# Patient Record
Sex: Female | Born: 1995 | Race: White | Hispanic: No | Marital: Single | Smoking: Never smoker
Health system: Southern US, Community
[De-identification: ages and names within clinical notes are randomized; demographics above are authoritative.]

## PROBLEM LIST (undated history)

## (undated) DIAGNOSIS — M419 Scoliosis, unspecified: Secondary | ICD-10-CM

## (undated) DIAGNOSIS — A6920 Lyme disease, unspecified: Secondary | ICD-10-CM

## (undated) HISTORY — PX: WISDOM TOOTH EXTRACTION: SHX21

---

## 2014-03-29 ENCOUNTER — Emergency Department (HOSPITAL_COMMUNITY): Payer: 59

## 2014-03-29 ENCOUNTER — Emergency Department (HOSPITAL_COMMUNITY)
Admission: EM | Admit: 2014-03-29 | Discharge: 2014-03-29 | Disposition: A | Discharge status: Home / Self Care | Payer: 59 | Attending: Emergency Medicine | Admitting: Emergency Medicine

## 2014-03-29 ENCOUNTER — Encounter (HOSPITAL_COMMUNITY): Payer: Self-pay | Admitting: Emergency Medicine

## 2014-03-29 DIAGNOSIS — R52 Pain, unspecified: Secondary | ICD-10-CM

## 2014-03-29 DIAGNOSIS — Y998 Other external cause status: Secondary | ICD-10-CM | POA: Diagnosis not present

## 2014-03-29 DIAGNOSIS — Y9389 Activity, other specified: Secondary | ICD-10-CM | POA: Diagnosis not present

## 2014-03-29 DIAGNOSIS — W109XXA Fall (on) (from) unspecified stairs and steps, initial encounter: Secondary | ICD-10-CM | POA: Diagnosis not present

## 2014-03-29 DIAGNOSIS — Z8619 Personal history of other infectious and parasitic diseases: Secondary | ICD-10-CM | POA: Diagnosis not present

## 2014-03-29 DIAGNOSIS — Z8739 Personal history of other diseases of the musculoskeletal system and connective tissue: Secondary | ICD-10-CM | POA: Diagnosis not present

## 2014-03-29 DIAGNOSIS — S93402A Sprain of unspecified ligament of left ankle, initial encounter: Secondary | ICD-10-CM | POA: Insufficient documentation

## 2014-03-29 DIAGNOSIS — Y9289 Other specified places as the place of occurrence of the external cause: Secondary | ICD-10-CM | POA: Insufficient documentation

## 2014-03-29 DIAGNOSIS — S99912A Unspecified injury of left ankle, initial encounter: Secondary | ICD-10-CM | POA: Diagnosis present

## 2014-03-29 HISTORY — DX: Scoliosis, unspecified: M41.9

## 2014-03-29 HISTORY — DX: Lyme disease, unspecified: A69.20

## 2014-03-29 MED ORDER — IBUPROFEN 600 MG PO TABS: 600.0000 mg | ORAL_TABLET | Freq: Four times a day (QID) | ORAL | Status: AC | PRN

## 2014-03-29 MED ORDER — IBUPROFEN 800 MG PO TABS
800.0000 mg | ORAL_TABLET | Freq: Once | ORAL | Status: AC
  Administered 2014-03-29: 800 mg via ORAL
  Filled 2014-03-29: qty 1

## 2014-03-29 NOTE — ED Notes (Signed)
Pt presents with L ankle pain after slipping on steps yesterday with wet shoes in her apt building at [employer]. Ace wrap in place.

## 2014-03-29 NOTE — Discharge Instructions (Signed)
Ankle Sprain °An ankle sprain is an injury to the strong, fibrous tissues (ligaments) that hold the bones of your ankle joint together.  °CAUSES °An ankle sprain is usually caused by a fall or by twisting your ankle. Ankle sprains most commonly occur when you step on the outer edge of your foot, and your ankle turns inward. People who participate in sports are more prone to these types of injuries.  °SYMPTOMS  °· Pain in your ankle. The pain may be present at rest or only when you are trying to stand or walk. °· Swelling. °· Bruising. Bruising may develop immediately or within 1 to 2 days after your injury. °· Difficulty standing or walking, particularly when turning corners or changing directions. °DIAGNOSIS  °Your caregiver will ask you details about your injury and perform a physical exam of your ankle to determine if you have an ankle sprain. During the physical exam, your caregiver will press on and apply pressure to specific areas of your foot and ankle. Your caregiver will try to move your ankle in certain ways. An X-ray exam may be done to be sure a bone was not broken or a ligament did not separate from one of the bones in your ankle (avulsion fracture).  °TREATMENT  °Certain types of braces can help stabilize your ankle. Your caregiver can make a recommendation for this. Your caregiver may recommend the use of medicine for pain. If your sprain is severe, your caregiver may refer you to a surgeon who helps to restore function to parts of your skeletal system (orthopedist) or a physical therapist. °HOME CARE INSTRUCTIONS  °· Apply ice to your injury for 1-2 days or as directed by your caregiver. Applying ice helps to reduce inflammation and pain. °¨ Put ice in a plastic bag. °¨ Place a towel between your skin and the bag. °¨ Leave the ice on for 15-20 minutes at a time, every 2 hours while you are awake. °· Only take over-the-counter or prescription medicines for pain, discomfort, or fever as directed by  your caregiver. °· Elevate your injured ankle above the level of your heart as much as possible for 2-3 days. °· If your caregiver recommends crutches, use them as instructed. Gradually put weight on the affected ankle. Continue to use crutches or a cane until you can walk without feeling pain in your ankle. °· If you have a plaster splint, wear the splint as directed by your caregiver. Do not rest it on anything harder than a pillow for the first 24 hours. Do not put weight on it. Do not get it wet. You may take it off to take a shower or bath. °· You may have been given an elastic bandage to wear around your ankle to provide support. If the elastic bandage is too tight (you have numbness or tingling in your foot or your foot becomes cold and blue), adjust the bandage to make it comfortable. °· If you have an air splint, you may blow more air into it or let air out to make it more comfortable. You may take your splint off at night and before taking a shower or bath. Wiggle your toes in the splint several times per day to decrease swelling. °SEEK MEDICAL CARE IF:  °· You have rapidly increasing bruising or swelling. °· Your toes feel extremely cold or you lose feeling in your foot. °· Your pain is not relieved with medicine. °SEEK IMMEDIATE MEDICAL CARE IF: °· Your toes are numb or blue. °·   You have severe pain that is increasing. °MAKE SURE YOU:  °· Understand these instructions. °· Will watch your condition. °· Will get help right away if you are not doing well or get worse. °Document Released: 04/14/2005 Document Revised: 01/07/2012 Document Reviewed: 04/26/2011 °ExitCare® Patient Information ©2015 [employer]. This information is not intended to replace advice given to you by your health care provider. Make sure you discuss any questions you have with your health care provider. ° ° °Emergency Department Resource Guide °1) Find a Doctor and Pay Out of Pocket °Although you won't have to find out who is covered  by your insurance plan, it is a good idea to ask around and get recommendations. You will then need to call the office and see if the doctor you have chosen will accept you as a new patient and what types of options they offer for patients who are self-pay. Some doctors offer discounts or will set up payment plans for their patients who do not have insurance, but you will need to ask so you aren't surprised when you get to your appointment. ° °2) Contact Your Local Health Department °Not all health departments have doctors that can see patients for sick visits, but many do, so it is worth a call to see if yours does. If you don't know where your local health department is, you can check in your phone book. The CDC also has a tool to help you locate your state's health department, and many state websites also have listings of all of their local health departments. ° °3) Find a Walk-in Clinic °If your illness is not likely to be very severe or complicated, you may want to try a walk in clinic. These are popping up all over the country in pharmacies, drugstores, and shopping centers. They're usually staffed by nurse practitioners or physician assistants that have been trained to treat common illnesses and complaints. They're usually fairly quick and inexpensive. However, if you have serious medical issues or chronic medical problems, these are probably not your best option. ° °No Primary Care Doctor: °- Call [facility] at  832-8000 - they can help you locate a primary care doctor that  accepts your insurance, provides certain services, etc. °- Physician Referral Service- 1-800-533-3463 ° °Chronic Pain Problems: °Organization         Address  Phone   Notes  °[facility]  (336) 297-2271 Patients need to be referred by their primary care doctor.  ° °Medication Assistance: °Organization         Address  Phone   Notes  °[place] [facility] [address] °[place], [place] [address] (336) 641-8030 --Must be a resident of [place] °-- Must have NO insurance coverage whatsoever (no Medicaid/ Medicare, etc.) °-- The pt. MUST have a primary care doctor that directs their care regularly and follows them in the community °  °[facility]  (866) 331-1348   °[employer]  (888) 892-1162   ° °Agencies that provide inexpensive medical care: °Organization         Address  Phone   Notes  °[facility]  (336) 832-8035   °[facility]    (336) 832-7272   °[facility] [address] °[place], [place] [address] (336) 832-4777   °[facility] [address], °[place] (336) 271-4999   °[facility]    (336) 373-0678   °[facility]    (  336) 272-1050   °[facility] ° [address], [place] Phone:  (336) 832-4444, Fax:  (336) 832-4440 Hours of Operation:  9 am - 6 pm, M-F.  Also accepts Medicaid/Medicare and self-pay.  °[facility] ° [address], [place] Phone: (336) 832-3150, Fax: (336) 832-3151. Hours of Operation:  8:30 am - 5:30 pm, M-F.  Also accepts Medicaid and self-pay.  °[facility] [place] [address], [place] Phone: (336) 878-6027   °[facility] [address], [place], [place] (336)723-1848, Ext. 123 Mondays & Thursdays: 7-9 AM.  First 15 patients are seen on a first come, first serve basis. °  ° °Medicaid-accepting [place] Providers: ° °Organization         Address  Phone   Notes  °[facility] [address], [place] (336) 641-2100 Also accepts self-pay patients.  °[facility] [address], [place] ° (336) 856-9996   °[facility] [address], [place] (336) 288-8857   °[facility] [address] [place] [address], [place] (336) 299-7000   °Veita Bland [address], [place]  ° (336) 373-1557 Only accepts [place] Access Medicaid patients after they have their name applied to their card.  ° °Self-Pay (no insurance) in [place]: ° °Organization         Address  Phone   Notes  °Sickle Cell Patients, [facility] [address], [place] (336) 832-1970   °[facility] [address], [place] (336) 832-4400   °[facility] ° [address], [place] (336) 992-4800   °[facility]/Dr. Osei-Bonsu ° [address] [place] [address], [place] or [address], [place] (336) 841-8500 Phone number for both [place] and [place] locations is the same.  °[facility] [address], [place] (336) 299-0000   °[facility] [address] [place] [address], [place] or [address] (336) 852-7530 °(336) 878-2260   °[facility] [address], [place] (336) 350-1642, phone; (336) 294-5005, fax Sees patients 1st and 3rd Saturday of every month.  Must not qualify for public or private insurance (i.e. Medicaid, Medicare, [place] Health Choice, Veterans' Benefits) • Household income should be no more than 200% of the poverty level •The clinic cannot treat you if you are pregnant or think you are pregnant • Sexually transmitted diseases are not treated at the clinic.  ° ° °Dental Care: °Organization         Address  Phone  Notes  °[facility] [address], [place] (336) 641-6152 Accepts children up to age 21 who are enrolled in Medicaid or [place] Health Choice; pregnant women with a Medicaid card; and children who have applied for Medicaid or [place] Health Choice, but were declined, whose parents can pay a reduced fee at time of service.  °[facility] [place]  [address], [place] (336) 641-7733 Accepts children up to age 21 who are enrolled  in Medicaid or [place] Health Choice; pregnant women with a Medicaid card; and children who have applied for Medicaid or [place] Health Choice, but were declined, whose parents can pay a reduced fee at time of service.  °[facility] ° [address], [place] (336) 641-4533 Patients are seen by appointment only. Walk-ins are not accepted. [facility] will see patients 18 years of age and older. °Monday - Tuesday (8am-5pm) °Most Wednesdays (8:30-5pm) °$30 per visit, cash only  °[facility] ° [address], [place] (336) 641-4533 Patients are seen by appointment only. Walk-ins are not accepted. [facility] will see patients 18 years of age and older. °One Wednesday Evening (Monthly: Volunteer Based).  $30 per visit, cash only  °[facility]  (919) 537-3737 for adults; Children under age 4, call Graduate Pediatric Dentistry at (919) [phone]. Children aged 4-14, please call (919) [phone] to request a pediatric application. ° Dental services are provided in all areas of dental care including fillings, crowns and bridges, complete and partial dentures, implants, gum treatment, root canals, and extractions. Preventive care is also provided. Treatment is provided to both adults and children. °Patients are selected via a lottery and there is often a waiting list. °  °[facility] [address], °[place] ° (336) 763-8833 [email or web address] °  °[facility] [address], [place], [place] (336)723-1848, Ext. 123 Second and Fourth Thursday of each month, opens at 6:30 AM; Clinic ends at 9 AM.  Patients are seen on a first-come first-served basis, and a limited number are seen during each clinic.  ° °[facility] ° [address], [place], [place] (336) 723-7904   Eligibility Requirements °You must have lived in [place], or [place] counties for at least the last three months. °  You cannot be  eligible for state or federal sponsored healthcare insurance, including Veterans Administration, Medicaid, or Medicare. °  You generally cannot be eligible for healthcare insurance through your employer.  °  How to apply: °Eligibility screenings are held every Tuesday and Wednesday afternoon from 1:00 pm until 4:00 pm. You do not need an appointment for the interview!  °[facility] [address], [place], [place] 336-631-2330   °[facility]  336-342-8273   °[facility]  336-703-3100   °[facility]  336-570-6415   ° °Behavioral Health Resources in the Community: °Intensive Outpatient Programs °Organization         Address  Phone  Notes  °[facility] [address], [place], [place] 336-878-6098   °[facility] [address], [place], [place] 336-832-9800   °[facility] [address], [place], [place] ° 336-882-2125   °[facility] [address],  °[place], [place] 1-800-853-5163 or 336-641-4981   °Substance Abuse Resources °Organization         Address  Phone  Notes  °[facility]  336-882-2125   °[facility]  336-784-9470   °[facility]  336-285-9073   °[facility]  336-845-3988   °Residential & Outpatient Substance Abuse Program  1-800-659-3381   °Psychological Services °Organization         Address  Phone  Notes  °[facility]  336- 832-9600   °[facility]  336- 378-7881   °[facility] [address], [place] 1-800-853-5163 or 336-641-4981   ° °Mobile Crisis Teams °Organization         Address  Phone  Notes  °[facility], Mobile Crisis Care Unit  1-877-626-1772   °Assertive °[facility] ° [address] ,    336-834-9664   °Sharon DeEsch [address] °[place] 336-554-5454   ° °Self-Help/Support Groups °Organization          Address  Phone             Notes  °[facility] - variety of support groups  336- 373-1402 Call for more information  °Narcotics Anonymous (NA), [facility] [address], °[place]  2 meetings at this location  ° °Residential Treatment Programs °Organization         Address  Phone  Notes  °[facility] [address],    °[place]  1-866-801-8205   °[facility] ° [address], [place], [place] 704-293-8524   °[facility] [address], [place] 336-845-3988 Admissions: 8am-3pm M-F  °[facility] [address]    °[place], [place] 336-841-1104   °[facility] [address], [place], [place] 336-379-7146   °[facility] [address]  °[place], [place] 336-285-9073   °[facility] Intensive Outpatient [address], [place], [place] 336-852-3033   °[facility] [address]  °[place], [place] 1-877-615-2722 or 336-784-9470   °[facility] [address] [place], [place] 336-227-7417 Accepts Medicaid  °[facility] [address]  °[place] 1-800-659-3381 Substance Abuse/Addiction Treatment  ° °[place] Behavioral Health Resources °Organization         Address  Phone  Notes  °[facility]  (888) 581-9988   °Julie Brannon, PhD [address] [place], [place]   (336) 349-5553 or (336) 951-0000   °[facility]   [address] °[place], [place] (336) 349-4454   °[facility] [address], [place], [place] (336) 342-8316 Insurance/Medicaid/sponsorship through [facility]  °[facility] [address] [place] [address]                                    [place], [place] (336) 342-8316 Therapy/tele-psych/case  °[facility] [address]  ° [place], [place] (336) 349-2233    °Dr. Arfeen  (336) 349-4544   °[facility] [place]  [employer]  [place] [facility] 1) [address],  °2) [address], [place] °3)  [address], [place] (336) 349-3220 °(336) 342-7768 ° °(336) 342-8140   °[place] [facility] (336) 342-1394 or (336) 342-3537 (After Hours)    ° ° ° °

## 2014-03-29 NOTE — ED Notes (Signed)
Patient called to treatment room x1, no answer.  

## 2014-03-29 NOTE — ED Provider Notes (Signed)
CSN: [id number]     Arrival date & time 03/29/14  2011 History   First MD Initiated Contact with Patient 03/29/14 2134     Chief Complaint  Patient presents with  . Ankle Pain     (Consider location/radiation/quality/duration/timing/severity/associated sxs/prior Treatment) HPI The patient slipped on stairs yesterday causing her left ankle to roll inward. She reports she has been walking on it today because he needed to go to class and take tests. She reports however weightbearing has been painful. She worse or is some swelling. Past Medical History  Diagnosis Date  . Scoliosis   . Lyme disease    Past Surgical History  Procedure Laterality Date  . Wisdom tooth extraction     No family history on file. History  Substance Use Topics  . Smoking status: Never Smoker   . Smokeless tobacco: Not on file  . Alcohol Use: Yes     Comment: occ   OB History    No data available     Review of Systems Constitutional: No fevers no chills   Allergies  Review of patient's allergies indicates no known allergies.  Home Medications   Prior to Admission medications   Medication Sig Start Date End Date Taking? Authorizing Provider  ibuprofen (ADVIL,MOTRIN) 600 MG tablet Take 1 tablet (600 mg total) by mouth every 6 (six) hours as needed. 03/29/14   Arby BarretteMarcy Tylyn Stankovich, MD   BP 112/73 mmHg  Pulse 99  Temp(Src) 97.9 F (36.6 C) (Oral)  Resp 18  Ht 5\' 8"  (1.727 m)  Wt 120 lb (54.432 kg)  BMI 18.25 kg/m2  SpO2 100%  LMP 03/15/2014 Physical Exam Gen.: Well-nourished well-developed healthy appearing female. Extremity: Left ankle is examined and has a very mild associated effusion. The lateral malleolar nontender. Pain reproducible with inversion. A normal neurovascular exam. ED Course  Procedures (including critical care time) Labs Review Labs Reviewed - No data to display  Imaging Review Dg Ankle Complete Left  03/29/2014   CLINICAL DATA:  Injury yesterday  EXAM: LEFT ANKLE COMPLETE  - 3+ VIEW  COMPARISON:  None.  FINDINGS: There is no evidence of fracture, dislocation, or joint effusion. There is no evidence of arthropathy or other focal bone abnormality. Soft tissues are unremarkable.  IMPRESSION: Negative.   Electronically Signed   By: Maryclare BeanArt  Hoss M.D.   On: 03/29/2014 21:55     EKG Interpretation None      MDM   Final diagnoses:  Ankle sprain, left, initial encounter   Findings consistent with an uncomplicated ankle sprain.    Arby BarretteMarcy Dawson Hollman, MD 03/29/14 2207

## 2016-07-08 IMAGING — CR DG ANKLE COMPLETE 3+V*L*
3 series · 3 of 3 positions shown · non-contrast
Comparison: None.

CLINICAL DATA: Injury yesterday

EXAM:
LEFT ANKLE COMPLETE - 3+ VIEW

[t ankle joint ap left]
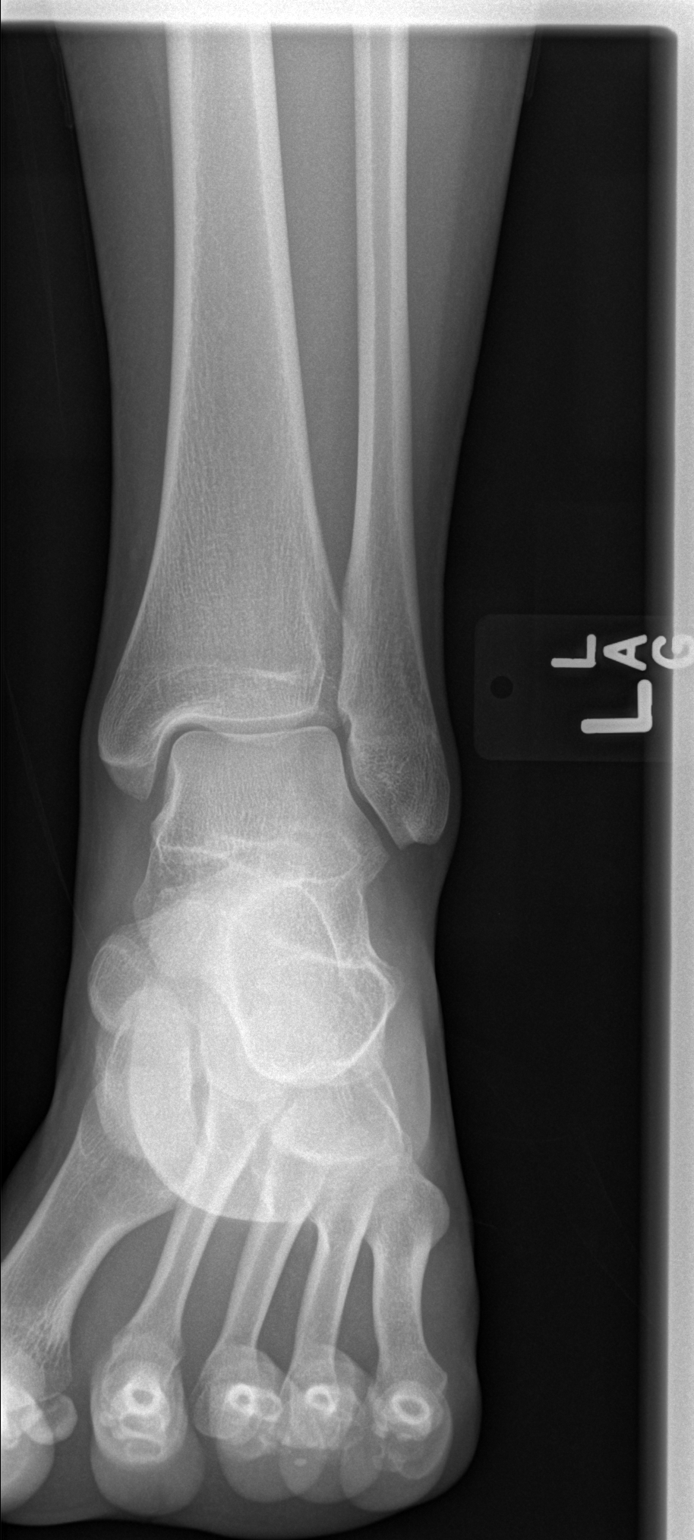

[t ankle joint oblique left]
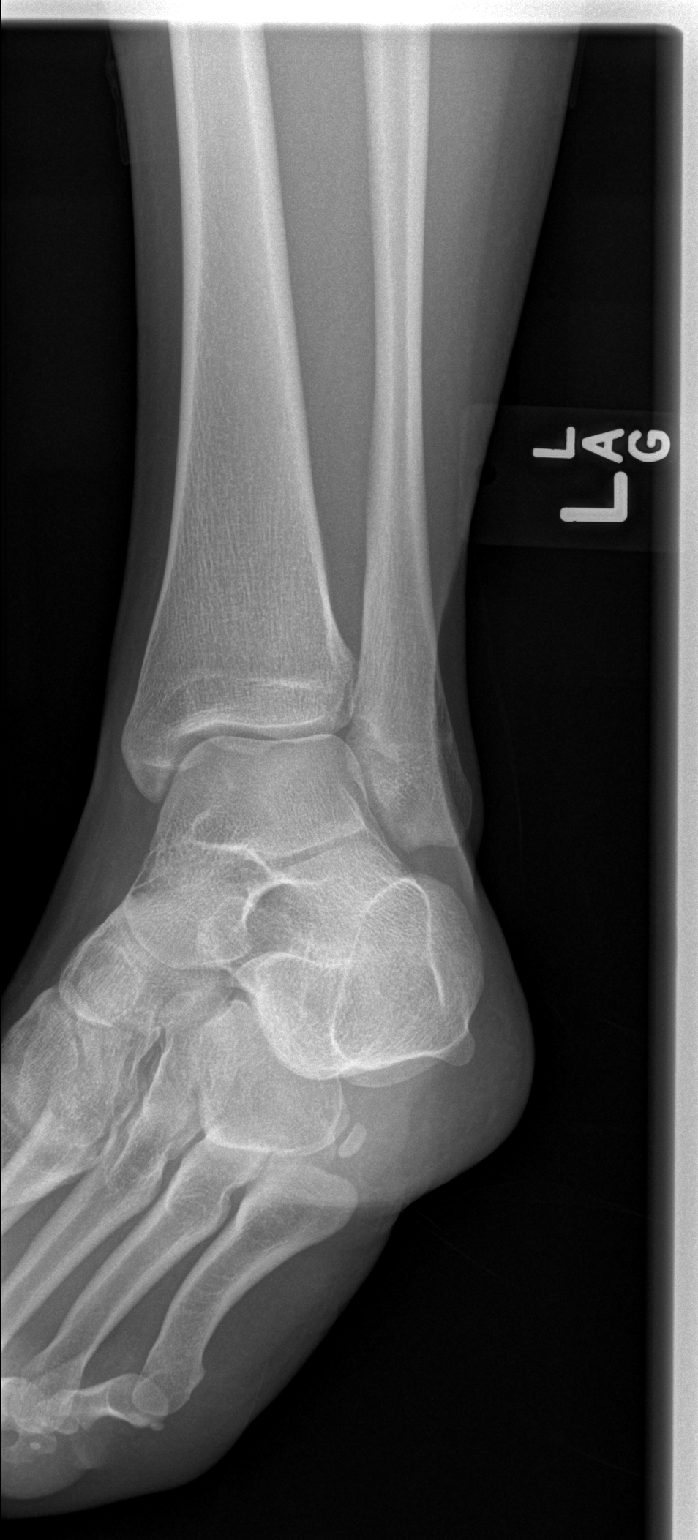

[t ankle joint lat left]
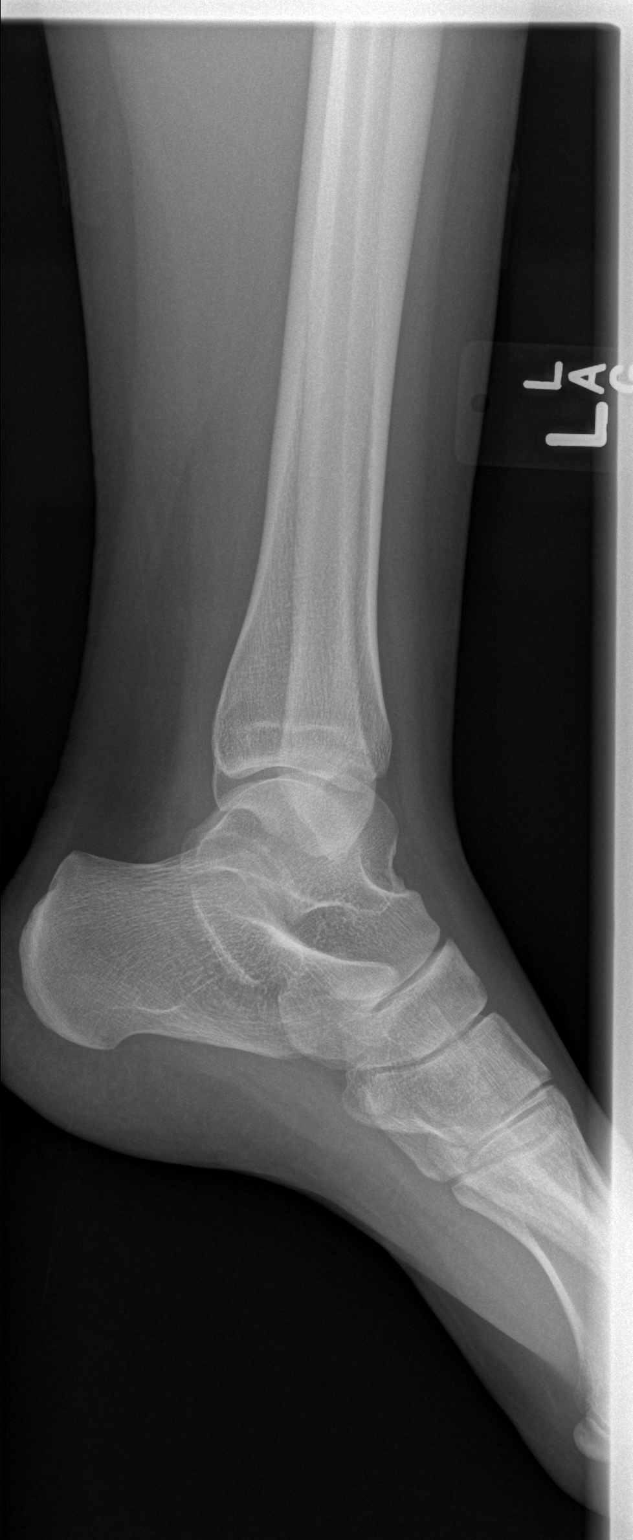

[3 of 3 positions shown; findings below may reference images not displayed]

FINDINGS: There is no evidence of fracture, dislocation, or joint effusion.
There is no evidence of arthropathy or other focal bone abnormality.
Soft tissues are unremarkable.
IMPRESSION: Negative.
